# Patient Record
Sex: Male | Born: 1960 | Race: Black or African American | Hispanic: No | Marital: Single
Health system: Southern US, Community
[De-identification: ages and names within clinical notes are randomized; demographics above are authoritative.]

## PROBLEM LIST (undated history)

## (undated) DIAGNOSIS — N200 Calculus of kidney: Secondary | ICD-10-CM

---

## 2010-11-11 ENCOUNTER — Emergency Department (HOSPITAL_COMMUNITY)
Admission: EM | Admit: 2010-11-11 | Discharge: 2010-11-11 | Attending: Emergency Medicine | Admitting: Emergency Medicine

## 2010-11-11 ENCOUNTER — Emergency Department (HOSPITAL_COMMUNITY)

## 2010-11-11 DIAGNOSIS — N200 Calculus of kidney: Secondary | ICD-10-CM | POA: Insufficient documentation

## 2010-11-11 DIAGNOSIS — R1032 Left lower quadrant pain: Secondary | ICD-10-CM | POA: Insufficient documentation

## 2010-11-11 DIAGNOSIS — E119 Type 2 diabetes mellitus without complications: Secondary | ICD-10-CM | POA: Insufficient documentation

## 2010-11-11 LAB — URINALYSIS, ROUTINE W REFLEX MICROSCOPIC
Bilirubin Urine: NEGATIVE
Ketones, ur: NEGATIVE mg/dL
Nitrite: NEGATIVE
Protein, ur: NEGATIVE mg/dL
Specific Gravity, Urine: 1.025 (ref 1.005–1.030)
Urobilinogen, UA: 0.2 mg/dL (ref 0.0–1.0)

## 2010-11-11 LAB — URINE MICROSCOPIC-ADD ON

## 2011-03-26 ENCOUNTER — Emergency Department (HOSPITAL_COMMUNITY)
Admission: EM | Admit: 2011-03-26 | Discharge: 2011-03-26 | Attending: Emergency Medicine | Admitting: Emergency Medicine

## 2011-03-26 ENCOUNTER — Emergency Department (HOSPITAL_COMMUNITY)

## 2011-03-26 ENCOUNTER — Encounter: Payer: Self-pay | Admitting: *Deleted

## 2011-03-26 DIAGNOSIS — Z87442 Personal history of urinary calculi: Secondary | ICD-10-CM | POA: Insufficient documentation

## 2011-03-26 DIAGNOSIS — Z87891 Personal history of nicotine dependence: Secondary | ICD-10-CM | POA: Insufficient documentation

## 2011-03-26 DIAGNOSIS — R1012 Left upper quadrant pain: Secondary | ICD-10-CM | POA: Insufficient documentation

## 2011-03-26 DIAGNOSIS — R10819 Abdominal tenderness, unspecified site: Secondary | ICD-10-CM | POA: Insufficient documentation

## 2011-03-26 DIAGNOSIS — R1011 Right upper quadrant pain: Secondary | ICD-10-CM | POA: Insufficient documentation

## 2011-03-26 DIAGNOSIS — R109 Unspecified abdominal pain: Secondary | ICD-10-CM | POA: Insufficient documentation

## 2011-03-26 DIAGNOSIS — K5900 Constipation, unspecified: Secondary | ICD-10-CM | POA: Insufficient documentation

## 2011-03-26 DIAGNOSIS — E119 Type 2 diabetes mellitus without complications: Secondary | ICD-10-CM | POA: Insufficient documentation

## 2011-03-26 DIAGNOSIS — R1013 Epigastric pain: Secondary | ICD-10-CM | POA: Insufficient documentation

## 2011-03-26 DIAGNOSIS — R110 Nausea: Secondary | ICD-10-CM | POA: Insufficient documentation

## 2011-03-26 HISTORY — DX: Calculus of kidney: N20.0

## 2011-03-26 LAB — URINALYSIS, ROUTINE W REFLEX MICROSCOPIC
Bilirubin Urine: NEGATIVE
Glucose, UA: NEGATIVE mg/dL
Hgb urine dipstick: NEGATIVE
Ketones, ur: >80 mg/dL — AB
Leukocytes, UA: NEGATIVE
Nitrite: NEGATIVE
Protein, ur: NEGATIVE mg/dL
Specific Gravity, Urine: 1.025 (ref 1.005–1.030)
Urobilinogen, UA: 0.2 mg/dL (ref 0.0–1.0)
pH: 8 (ref 5.0–8.0)

## 2011-03-26 LAB — CBC
HCT: 45.6 % (ref 39.0–52.0)
MCH: 24.3 pg — ABNORMAL LOW (ref 26.0–34.0)
MCHC: 32 g/dL (ref 30.0–36.0)
Platelets: 256 10*3/uL (ref 150–400)
RBC: 6.02 MIL/uL — ABNORMAL HIGH (ref 4.22–5.81)
WBC: 10.6 10*3/uL — ABNORMAL HIGH (ref 4.0–10.5)

## 2011-03-26 LAB — BASIC METABOLIC PANEL
Calcium: 9.6 mg/dL (ref 8.4–10.5)
Creatinine, Ser: 1.29 mg/dL (ref 0.50–1.35)
GFR calc non Af Amer: 64 mL/min — ABNORMAL LOW (ref >90)
Glucose, Bld: 111 mg/dL — ABNORMAL HIGH (ref 70–99)
Sodium: 135 mEq/L (ref 135–145)

## 2011-03-26 MED ORDER — HYDROMORPHONE HCL 1 MG/ML IJ SOLN
1.0000 mg | Freq: Once | INTRAMUSCULAR | Status: AC
  Administered 2011-03-26: 1 mg via INTRAVENOUS
  Filled 2011-03-26: qty 1

## 2011-03-26 MED ORDER — SODIUM CHLORIDE 0.9 % IV BOLUS (SEPSIS)
1000.0000 mL | Freq: Once | INTRAVENOUS | Status: AC
  Administered 2011-03-26: 1000 mL via INTRAVENOUS

## 2011-03-26 MED ORDER — PEG 3350-KCL-NA BICARB-NACL 420 G PO SOLR
4000.0000 mL | Freq: Once | ORAL | Status: AC
  Administered 2011-03-26: 4000 mL via ORAL
  Filled 2011-03-26: qty 4000

## 2011-03-26 MED ORDER — ONDANSETRON HCL 4 MG/2ML IJ SOLN
4.0000 mg | Freq: Once | INTRAMUSCULAR | Status: AC
  Administered 2011-03-26: 4 mg via INTRAVENOUS
  Filled 2011-03-26: qty 2

## 2011-03-26 NOTE — ED Notes (Signed)
MD at bedside. 

## 2011-03-26 NOTE — ED Notes (Addendum)
Pain lt flank, worse with deep breath, no fever or cough, Nausea, unable to eat due to nausea.

## 2011-03-26 NOTE — ED Notes (Signed)
Creta Levin RN at [facility] given patient's results to test, EDP's diagnosis, and discharge instrctions with Golytely instructions-Sharon Hyacinth Meeker RN verbalized understanding. Officer given copy of ED notes, EDP's assessment, and results printed by EDP for patient chart.

## 2011-03-26 NOTE — ED Notes (Signed)
Pt reports abdominal pain for 2 days w/ nausea.

## 2011-03-26 NOTE — ED Provider Notes (Addendum)
History     CSN: [id number] Arrival date & time: 03/26/2011  1:28 AM  Chief Complaint  Patient presents with  . Flank Pain    (Consider location/radiation/quality/duration/timing/severity/associated sxs/prior treatment) HPI Comments: Seen 0241. [facility] with abdominal pain that has been present for most of the day. States pain has been intermittent and crampy. Was given MOM without results. Denies fever, chills, chest pain, shortness of breath. States that pain seems to get worse when he takes a deep breath. Mild nusea, no vomiting.   Patient is a 50 y.o. male presenting with abdominal pain. The history is provided by the patient.  Abdominal Pain The primary symptoms of the illness include abdominal pain and nausea. The current episode started 3 to 5 hours ago. The onset of the illness was sudden. The problem has been gradually worsening.  The abdominal pain radiates to the LUQ, epigastric region and RUQ. The severity of the abdominal pain is 8/10. The abdominal pain is relieved by nothing.  The patient has had a change in bowel habit (constipated). Additional symptoms associated with the illness include constipation.    Past Medical History  Diagnosis Date  . Kidney stone   . Diabetes mellitus     History reviewed. No pertinent past surgical history.  History reviewed. No pertinent family history.  History  Substance Use Topics  . Smoking status: Former Games developer  . Smokeless tobacco: Not on file  . Alcohol Use: No      Review of Systems  Gastrointestinal: Positive for nausea, abdominal pain and constipation.  All other systems reviewed and are negative.    Allergies  Review of patient's allergies indicates no known allergies.  Home Medications   Current Outpatient Rx  Name Route Sig Dispense Refill  . MAGNESIUM HYDROXIDE 400 MG/5ML PO SUSP Oral Take by mouth daily as needed.      Marland Kitchen METFORMIN HCL 500 MG PO TABS Oral Take 500 mg by mouth 3  (three) times daily.        BP 124/74  Pulse 84  Temp 98.4 F (36.9 C)  Resp 16  Wt 157 lb (71.215 kg)  SpO2 98%  Physical Exam  Nursing note and vitals reviewed. Constitutional: He is oriented to person, place, and time. He appears well-developed and well-nourished. No distress.  HENT:  Right Ear: External ear normal.  Left Ear: External ear normal.  Nose: Nose normal.  Mouth/Throat: Oropharynx is clear and moist.  Eyes: EOM are normal.  Neck: Normal range of motion. Neck supple.  Cardiovascular: Normal rate, normal heart sounds and intact distal pulses.   Pulmonary/Chest: Effort normal and breath sounds normal.  Abdominal: Soft. He exhibits no distension and no mass. There is tenderness. There is no guarding.       Mild epigastric tenderness  Neurological: He is oriented to person, place, and time.  Skin: Skin is dry.    ED Course  Procedures (including critical care time) Results for orders placed during the hospital encounter of 03/26/11  URINALYSIS, ROUTINE W REFLEX MICROSCOPIC      Component Value Range   Color, Urine AMBER (*) YELLOW    Appearance CLEAR  CLEAR    Specific Gravity, Urine 1.025  1.005 - 1.030    pH 8.0  5.0 - 8.0    Glucose, UA NEGATIVE  NEGATIVE (mg/dL)   Hgb urine dipstick NEGATIVE  NEGATIVE    Bilirubin Urine NEGATIVE  NEGATIVE    Ketones, ur >80 (*) NEGATIVE (mg/dL)  Protein, ur NEGATIVE  NEGATIVE (mg/dL)   Urobilinogen, UA 0.2  0.0 - 1.0 (mg/dL)   Nitrite NEGATIVE  NEGATIVE    Leukocytes, UA NEGATIVE  NEGATIVE   CBC      Component Value Range   WBC 10.6 (*) 4.0 - 10.5 (K/uL)   RBC 6.02 (*) 4.22 - 5.81 (MIL/uL)   Hemoglobin 14.6  13.0 - 17.0 (g/dL)   HCT 40.9  81.1 - 91.4 (%)   MCV 75.7 (*) 78.0 - 100.0 (fL)   MCH 24.3 (*) 26.0 - 34.0 (pg)   MCHC 32.0  30.0 - 36.0 (g/dL)   RDW 78.2  95.6 - 21.3 (%)   Platelets 256  150 - 400 (K/uL)  BASIC METABOLIC PANEL      Component Value Range   Sodium 135  135 - 145 (mEq/L)   Potassium 3.8   3.5 - 5.1 (mEq/L)   Chloride 97  96 - 112 (mEq/L)   CO2 29  19 - 32 (mEq/L)   Glucose, Bld 111 (*) 70 - 99 (mg/dL)   BUN 13  6 - 23 (mg/dL)   Creatinine, Ser 0.86  0.50 - 1.35 (mg/dL)   Calcium 9.6  8.4 - 57.8 (mg/dL)   GFR calc non Af Amer 64 (*) >90 (mL/min)   GFR calc Af Amer 74 (*) >90 (mL/min)    Dg Abd Acute W/chest  03/26/2011  *RADIOLOGY REPORT*  Clinical Data: Left-sided abdominal pain.  No bowel movement for 4 days.  ACUTE ABDOMEN SERIES (ABDOMEN 2 VIEW & CHEST 1 VIEW)  Comparison: None.  Findings: The Normal heart size and pulmonary vascularity.  No focal consolidation in the lungs.  No blunting of costophrenic angles.  Scattered gas and stool in the colon.  No small or large bowel dilatation.  No free intra-abdominal air.  No abnormal air fluid levels.  No radiopaque stones.  Apparent old fracture deformity of the symphysis pubis region.  IMPRESSION: No evidence of active pulmonary disease.  Nonobstructive bowel gas pattern.  Original Report Authenticated By: Marlon Pel, M.D.   MDM  Patient with abdominal pain in the setting of constipation. Labs are unremarkable. Xray shows a non obstructive pattern with a large stool burden and gas under the diaphragm. IVF, analgesics, antiemetics decreased the pain. Patient given a paper copy of the film. Golytely used as one cup every 4 hours is a feasible choice if the prison can supervise administration. Will contact and check with nurse assigned to facility.Pt feels improved after observation and/or treatment in ED.Pt stable in ED with no significant deterioration in condition.The patient appears reasonably screened and/or stabilized for discharge and I doubt any other medical condition or other [facility] requiring further screening, evaluation, or treatment in the ED at this time prior to discharge. MDM Reviewed: nursing note and vitals Interpretation: labs and x-ray           Nicoletta Dress. Colon Branch, MD 03/26/11 4696  Nicoletta Dress. Colon Branch,  MD 03/26/11 2952  Nicoletta Dress. Colon Branch, MD 04/06/11 [phone]

## 2011-03-26 NOTE — ED Notes (Signed)
Pt resting calmly w/ eyes closed. Rise & fall of the chest noted. NAD noted at this time.   

## 2012-08-05 ENCOUNTER — Emergency Department: Payer: Self-pay | Admitting: Emergency Medicine

## 2014-11-03 ENCOUNTER — Emergency Department (HOSPITAL_COMMUNITY)
Admission: EM | Admit: 2014-11-03 | Discharge: 2014-11-03 | Disposition: A | Discharge status: Home / Self Care | Attending: Emergency Medicine | Admitting: Emergency Medicine

## 2014-11-03 ENCOUNTER — Encounter (HOSPITAL_COMMUNITY): Payer: Self-pay

## 2014-11-03 ENCOUNTER — Emergency Department (HOSPITAL_COMMUNITY)

## 2014-11-03 DIAGNOSIS — R109 Unspecified abdominal pain: Secondary | ICD-10-CM | POA: Diagnosis present

## 2014-11-03 DIAGNOSIS — Z87891 Personal history of nicotine dependence: Secondary | ICD-10-CM | POA: Diagnosis not present

## 2014-11-03 DIAGNOSIS — E119 Type 2 diabetes mellitus without complications: Secondary | ICD-10-CM | POA: Diagnosis not present

## 2014-11-03 DIAGNOSIS — N200 Calculus of kidney: Secondary | ICD-10-CM | POA: Diagnosis not present

## 2014-11-03 LAB — URINALYSIS, ROUTINE W REFLEX MICROSCOPIC
BILIRUBIN URINE: NEGATIVE
Glucose, UA: >1000 mg/dL — AB
HGB URINE DIPSTICK: NEGATIVE
KETONES UR: NEGATIVE mg/dL
Leukocytes, UA: NEGATIVE
NITRITE: NEGATIVE
PROTEIN: NEGATIVE mg/dL
SPECIFIC GRAVITY, URINE: 1.02 (ref 1.005–1.030)
Urobilinogen, UA: 0.2 mg/dL (ref 0.0–1.0)
pH: 8.5 — ABNORMAL HIGH (ref 5.0–8.0)

## 2014-11-03 LAB — CBC WITH DIFFERENTIAL/PLATELET
BASOS PCT: 0 % (ref 0–1)
Basophils Absolute: 0 10*3/uL (ref 0.0–0.1)
Eosinophils Absolute: 0 10*3/uL (ref 0.0–0.7)
Eosinophils Relative: 0 % (ref 0–5)
HCT: 47.8 % (ref 39.0–52.0)
Hemoglobin: 14.7 g/dL (ref 13.0–17.0)
LYMPHS PCT: 15 % (ref 12–46)
Lymphs Abs: 1.1 10*3/uL (ref 0.7–4.0)
MCH: 23.9 pg — ABNORMAL LOW (ref 26.0–34.0)
MCHC: 30.8 g/dL (ref 30.0–36.0)
MCV: 77.9 fL — AB (ref 78.0–100.0)
MONO ABS: 0.4 10*3/uL (ref 0.1–1.0)
Monocytes Relative: 6 % (ref 3–12)
Neutro Abs: 5.7 10*3/uL (ref 1.7–7.7)
Neutrophils Relative %: 79 % — ABNORMAL HIGH (ref 43–77)
PLATELETS: 208 10*3/uL (ref 150–400)
RBC: 6.14 MIL/uL — ABNORMAL HIGH (ref 4.22–5.81)
RDW: 14.3 % (ref 11.5–15.5)
WBC: 7.2 10*3/uL (ref 4.0–10.5)

## 2014-11-03 LAB — BASIC METABOLIC PANEL
ANION GAP: 11 (ref 5–15)
BUN: 12 mg/dL (ref 6–20)
CALCIUM: 9.8 mg/dL (ref 8.9–10.3)
CO2: 27 mmol/L (ref 22–32)
Chloride: 98 mmol/L — ABNORMAL LOW (ref 101–111)
Creatinine, Ser: 1.15 mg/dL (ref 0.61–1.24)
GFR calc non Af Amer: >60 mL/min (ref >60)
GLUCOSE: 179 mg/dL — AB (ref 65–99)
Potassium: 4.5 mmol/L (ref 3.5–5.1)
SODIUM: 136 mmol/L (ref 135–145)

## 2014-11-03 LAB — HEPATIC FUNCTION PANEL
ALBUMIN: 4.4 g/dL (ref 3.5–5.0)
ALK PHOS: 70 U/L (ref 38–126)
ALT: 53 U/L (ref 17–63)
AST: 40 U/L (ref 15–41)
BILIRUBIN DIRECT: 0.1 mg/dL (ref 0.1–0.5)
BILIRUBIN INDIRECT: 0.6 mg/dL (ref 0.3–0.9)
BILIRUBIN TOTAL: 0.7 mg/dL (ref 0.3–1.2)
Total Protein: 7.6 g/dL (ref 6.5–8.1)

## 2014-11-03 LAB — CBG MONITORING, ED: Glucose-Capillary: 171 mg/dL — ABNORMAL HIGH (ref 65–99)

## 2014-11-03 LAB — URINE MICROSCOPIC-ADD ON

## 2014-11-03 LAB — LIPASE, BLOOD: Lipase: 25 U/L (ref 22–51)

## 2014-11-03 MED ORDER — PROMETHAZINE HCL 25 MG PO TABS: 25.0000 mg | ORAL_TABLET | Freq: Four times a day (QID) | ORAL | Status: DC | PRN

## 2014-11-03 MED ORDER — FENTANYL CITRATE (PF) 100 MCG/2ML IJ SOLN
100.0000 ug | Freq: Once | INTRAMUSCULAR | Status: AC
  Administered 2014-11-03: 100 ug via INTRAVENOUS
  Filled 2014-11-03: qty 2

## 2014-11-03 MED ORDER — SODIUM CHLORIDE 0.9 % IV BOLUS (SEPSIS)
1000.0000 mL | Freq: Once | INTRAVENOUS | Status: AC
  Administered 2014-11-03: 1000 mL via INTRAVENOUS

## 2014-11-03 MED ORDER — NAPROXEN 500 MG PO TABS: 500.0000 mg | ORAL_TABLET | Freq: Two times a day (BID) | ORAL | Status: DC

## 2014-11-03 MED ORDER — HYDROMORPHONE HCL 1 MG/ML IJ SOLN
1.0000 mg | Freq: Once | INTRAMUSCULAR | Status: AC
  Administered 2014-11-03: 1 mg via INTRAVENOUS
  Filled 2014-11-03: qty 1

## 2014-11-03 MED ORDER — IOHEXOL 300 MG/ML  SOLN
25.0000 mL | Freq: Once | INTRAMUSCULAR | Status: AC | PRN
  Administered 2014-11-03: 25 mL via ORAL

## 2014-11-03 MED ORDER — IOHEXOL 300 MG/ML  SOLN
100.0000 mL | Freq: Once | INTRAMUSCULAR | Status: AC | PRN
  Administered 2014-11-03: 100 mL via INTRAVENOUS

## 2014-11-03 NOTE — Discharge Instructions (Signed)
Mr. Jerry Baird had a small kidney stone on the right side. Prescription for pain and nausea medicine. If symptoms persist, recommend seen the urologist. Phone number given.

## 2014-11-03 NOTE — ED Notes (Signed)
Patient states that pain had eased off a little, but when he threw up pain returned. Asking for more pain medication at this time.

## 2014-11-03 NOTE — ED Notes (Signed)
[facility]

## 2014-11-03 NOTE — ED Notes (Signed)
Pt c/o pain in r side of abd since 2:30 this afternoon.  Denies n/v.  Reports had diarrhea once early this morning.  Denies pain or burning with urination.

## 2014-11-03 NOTE — ED Provider Notes (Signed)
CSN: [id number]     Arrival date & time 11/03/14  1546 History   First MD Initiated Contact with Patient 11/03/14 1820     Chief Complaint  Patient presents with  . Abdominal Pain     (Consider location/radiation/quality/duration/timing/severity/associated sxs/prior Treatment) HPI.... Right anterior diffuse abdominal pain since approximately 1400 today. Patient ate lunch without issues. No vomiting, diarrhea, fever, chills, dysuria, hematuria. History of kidney stones in the past. Severity is moderate. Nothing makes symptoms better or worse. Past medical history includes diabetes. No radiation of pain to the flank  Past Medical History  Diagnosis Date  . Kidney stone   . Diabetes mellitus    History reviewed. No pertinent past surgical history. No family history on file. History  Substance Use Topics  . Smoking status: Former Games developermoker  . Smokeless tobacco: Not on file  . Alcohol Use: No    Review of Systems  All other systems reviewed and are negative.     Allergies  Review of patient's allergies indicates no known allergies.  Home Medications   Prior to Admission medications   Medication Sig Start Date End Date Taking? Authorizing Provider  naproxen (NAPROSYN) 500 MG tablet Take 1 tablet (500 mg total) by mouth 2 (two) times daily. 11/03/14   Donnetta HutchingBrian Sulma Ruffino, MD  promethazine (PHENERGAN) 25 MG tablet Take 1 tablet (25 mg total) by mouth every 6 (six) hours as needed. 11/03/14   Donnetta HutchingBrian Akeria Hedstrom, MD   BP 135/72 mmHg  Pulse 81  Temp(Src) 97.7 F (36.5 C) (Oral)  Resp 20  Ht 5\' 7"  (1.702 m)  Wt 167 lb (75.751 kg)  BMI 26.15 kg/m2  SpO2 97% Physical Exam  Constitutional: He is oriented to person, place, and time. He appears well-developed and well-nourished.  HENT:  Head: Normocephalic and atraumatic.  Eyes: Conjunctivae and EOM are normal. Pupils are equal, round, and reactive to light.  Neck: Normal range of motion. Neck supple.  Cardiovascular: Normal rate and regular  rhythm.   Pulmonary/Chest: Effort normal and breath sounds normal.  Abdominal:  Minimal right-sided abdominal tenderness  Musculoskeletal: Normal range of motion.  Neurological: He is alert and oriented to person, place, and time.  Skin: Skin is warm and dry.  Psychiatric: He has a normal mood and affect. His behavior is normal.  Nursing note and vitals reviewed.   ED Course  Procedures (including critical care time) Labs Review Labs Reviewed  URINALYSIS, ROUTINE W REFLEX [facility]CROSCOPIC ([facility]) - Abnormal; Notable for the following:    Color, Urine STRAW (*)    pH 8.5 (*)    Glucose, UA >1000 (*)    All other components within normal limits  CBC WITH DIFFERENTIAL/PLATELET - Abnormal; Notable for the following:    RBC 6.14 (*)    MCV 77.9 (*)    MCH 23.9 (*)    Neutrophils Relative % 79 (*)    All other components within normal limits  BASIC METABOLIC PANEL - Abnormal; Notable for the following:    Chloride 98 (*)    Glucose, Bld 179 (*)    All other components within normal limits  CBG MONITORING, ED - Abnormal; Notable for the following:    Glucose-Capillary 171 (*)    All other components within normal limits  HEPATIC FUNCTION PANEL  LIPASE, BLOOD  URINE MICROSCOPIC-ADD ON    Imaging Review Ct Abdomen Pelvis W Contrast  11/03/2014   CLINICAL DATA:  54 year old male with acute right-sided abdominal and pelvic pain today.  EXAM:  CT ABDOMEN AND PELVIS WITH CONTRAST  TECHNIQUE: Multidetector CT imaging of the abdomen and pelvis was performed using the standard protocol following bolus administration of intravenous contrast.  CONTRAST:  OMNIPAQUE IOHEXOL 300 MG/ML  SOLN  COMPARISON:  11/11/2010 CT  FINDINGS: Lower chest:  Unremarkable  Hepatobiliary: Fatty infiltration of the liver is noted. No focal hepatic abnormalities are identified. The gallbladder is unremarkable. There is no evidence of biliary dilatation.  Pancreas: Unremarkable  Spleen: Unremarkable   Adrenals/Urinary Tract: A 1.5 mm right UVJ calculus is noted. Right perinephric fluid is compatible with calyceal rupture and decompression of the collecting system. There is no evidence of hydronephrosis. The left kidney, adrenal glands and bladder are otherwise unremarkable.  Stomach/Bowel: Unremarkable. There is no evidence of bowel wall thickening, bowel obstruction or pneumoperitoneum. The appendix is normal.  Vascular/Lymphatic: Unremarkable. No enlarged lymph nodes or abdominal aortic aneurysm.  Reproductive: Mild prostate enlargement noted.  Other: No free fluid or abscess.  Musculoskeletal: No acute or suspicious abnormalities. Moderate degenerative disc disease and facet arthropathy in the lower lumbar spine noted.  IMPRESSION: 1.5 mm right UVJ calculus with right perinephric fluid, compatible with calyceal rupture and decompression of the right renal collecting system.  Hepatic steatosis.   Electronically Signed   By: Harmon Pier M.D.   On: 11/03/2014 19:48     EKG Interpretation None      MDM   Final diagnoses:  Right kidney stone    CT scan of abdomen/pelvis reveals a 1.5 mm right UVJ stone with right perinephric fluid compatible with calyceal rupture and decompression of the right renal collecting system.  Patient is hemodynamically stable. Pain management given. Discussed with urologist. Discharge medications Naprosyn 500 mg and Phenergan 25 mg    Donnetta Hutching, MD 11/03/14 2158

## 2015-07-14 ENCOUNTER — Observation Stay (HOSPITAL_COMMUNITY)
Admission: EM | Admit: 2015-07-14 | Discharge: 2015-07-15 | Disposition: A | Discharge status: Home / Self Care | Attending: Internal Medicine | Admitting: Internal Medicine

## 2015-07-14 ENCOUNTER — Other Ambulatory Visit: Payer: Self-pay

## 2015-07-14 ENCOUNTER — Emergency Department (HOSPITAL_COMMUNITY)

## 2015-07-14 ENCOUNTER — Encounter (HOSPITAL_COMMUNITY): Payer: Self-pay

## 2015-07-14 DIAGNOSIS — E118 Type 2 diabetes mellitus with unspecified complications: Secondary | ICD-10-CM | POA: Diagnosis not present

## 2015-07-14 DIAGNOSIS — E119 Type 2 diabetes mellitus without complications: Secondary | ICD-10-CM | POA: Diagnosis not present

## 2015-07-14 DIAGNOSIS — Z87891 Personal history of nicotine dependence: Secondary | ICD-10-CM | POA: Diagnosis not present

## 2015-07-14 DIAGNOSIS — N200 Calculus of kidney: Secondary | ICD-10-CM | POA: Insufficient documentation

## 2015-07-14 DIAGNOSIS — Z791 Long term (current) use of non-steroidal anti-inflammatories (NSAID): Secondary | ICD-10-CM | POA: Diagnosis not present

## 2015-07-14 DIAGNOSIS — E785 Hyperlipidemia, unspecified: Secondary | ICD-10-CM

## 2015-07-14 DIAGNOSIS — R0789 Other chest pain: Secondary | ICD-10-CM | POA: Diagnosis present

## 2015-07-14 DIAGNOSIS — R079 Chest pain, unspecified: Principal | ICD-10-CM | POA: Insufficient documentation

## 2015-07-14 DIAGNOSIS — I10 Essential (primary) hypertension: Secondary | ICD-10-CM

## 2015-07-14 LAB — COMPREHENSIVE METABOLIC PANEL
ALBUMIN: 4.4 g/dL (ref 3.5–5.0)
ALK PHOS: 94 U/L (ref 38–126)
ALT: 34 U/L (ref 17–63)
AST: 25 U/L (ref 15–41)
Anion gap: 9 (ref 5–15)
BUN: 17 mg/dL (ref 6–20)
CALCIUM: 9.9 mg/dL (ref 8.9–10.3)
CHLORIDE: 104 mmol/L (ref 101–111)
CO2: 25 mmol/L (ref 22–32)
CREATININE: 0.96 mg/dL (ref 0.61–1.24)
GFR calc Af Amer: >60 mL/min (ref >60)
GFR calc non Af Amer: >60 mL/min (ref >60)
GLUCOSE: 239 mg/dL — AB (ref 65–99)
Potassium: 4.4 mmol/L (ref 3.5–5.1)
SODIUM: 138 mmol/L (ref 135–145)
Total Bilirubin: 0.8 mg/dL (ref 0.3–1.2)
Total Protein: 7.5 g/dL (ref 6.5–8.1)

## 2015-07-14 LAB — CBC WITH DIFFERENTIAL/PLATELET
BASOS ABS: 0 10*3/uL (ref 0.0–0.1)
Basophils Relative: 0 %
EOS ABS: 0 10*3/uL (ref 0.0–0.7)
Eosinophils Relative: 0 %
HCT: 49.8 % (ref 39.0–52.0)
HEMOGLOBIN: 15.8 g/dL (ref 13.0–17.0)
LYMPHS ABS: 0.8 10*3/uL (ref 0.7–4.0)
Lymphocytes Relative: 12 %
MCH: 24.5 pg — AB (ref 26.0–34.0)
MCHC: 31.7 g/dL (ref 30.0–36.0)
MCV: 77.3 fL — ABNORMAL LOW (ref 78.0–100.0)
Monocytes Absolute: 0.4 10*3/uL (ref 0.1–1.0)
Monocytes Relative: 6 %
NEUTROS PCT: 82 %
Neutro Abs: 5.2 10*3/uL (ref 1.7–7.7)
Platelets: 174 10*3/uL (ref 150–400)
RBC: 6.44 MIL/uL — AB (ref 4.22–5.81)
RDW: 14.1 % (ref 11.5–15.5)
WBC: 6.4 10*3/uL (ref 4.0–10.5)

## 2015-07-14 LAB — TROPONIN I
Troponin I: <0.03 ng/mL (ref <0.031)
Troponin I: <0.03 ng/mL (ref <0.031)

## 2015-07-14 LAB — GLUCOSE, CAPILLARY: GLUCOSE-CAPILLARY: 120 mg/dL — AB (ref 65–99)

## 2015-07-14 MED ORDER — ENOXAPARIN SODIUM 40 MG/0.4ML ~~LOC~~ SOLN
40.0000 mg | SUBCUTANEOUS | Status: DC
  Administered 2015-07-14: 40 mg via SUBCUTANEOUS
  Filled 2015-07-14: qty 0.4

## 2015-07-14 MED ORDER — LISINOPRIL 10 MG PO TABS
10.0000 mg | ORAL_TABLET | Freq: Every day | ORAL | Status: DC
  Administered 2015-07-15: 10 mg via ORAL
  Filled 2015-07-14: qty 1

## 2015-07-14 MED ORDER — METOPROLOL TARTRATE 25 MG PO TABS
25.0000 mg | ORAL_TABLET | Freq: Two times a day (BID) | ORAL | Status: DC
  Administered 2015-07-14 – 2015-07-15 (×2): 25 mg via ORAL
  Filled 2015-07-14 (×2): qty 1

## 2015-07-14 MED ORDER — SODIUM CHLORIDE 0.9 % IV SOLN
INTRAVENOUS | Status: DC
  Administered 2015-07-14 – 2015-07-15 (×3): via INTRAVENOUS

## 2015-07-14 MED ORDER — ASPIRIN EC 325 MG PO TBEC
325.0000 mg | DELAYED_RELEASE_TABLET | Freq: Every day | ORAL | Status: DC
  Administered 2015-07-15: 325 mg via ORAL
  Filled 2015-07-14: qty 1

## 2015-07-14 MED ORDER — ONDANSETRON HCL 4 MG PO TABS: 4.0000 mg | ORAL_TABLET | Freq: Four times a day (QID) | ORAL | Status: DC | PRN

## 2015-07-14 MED ORDER — ATORVASTATIN CALCIUM 40 MG PO TABS
40.0000 mg | ORAL_TABLET | Freq: Every day | ORAL | Status: DC
  Administered 2015-07-14 – 2015-07-15 (×2): 40 mg via ORAL
  Filled 2015-07-14 (×2): qty 1

## 2015-07-14 MED ORDER — ONDANSETRON HCL 4 MG/2ML IJ SOLN: 4.0000 mg | Freq: Four times a day (QID) | INTRAMUSCULAR | Status: DC | PRN

## 2015-07-14 MED ORDER — INSULIN ASPART 100 UNIT/ML ~~LOC~~ SOLN: 0.0000 [IU] | Freq: Every day | SUBCUTANEOUS | Status: DC

## 2015-07-14 MED ORDER — SODIUM CHLORIDE 0.9% FLUSH
3.0000 mL | Freq: Two times a day (BID) | INTRAVENOUS | Status: DC
  Administered 2015-07-14: 3 mL via INTRAVENOUS

## 2015-07-14 MED ORDER — MORPHINE SULFATE (PF) 2 MG/ML IV SOLN: 2.0000 mg | INTRAVENOUS | Status: DC | PRN

## 2015-07-14 MED ORDER — INSULIN ASPART 100 UNIT/ML ~~LOC~~ SOLN
0.0000 [IU] | Freq: Three times a day (TID) | SUBCUTANEOUS | Status: DC
  Administered 2015-07-15: 3 [IU] via SUBCUTANEOUS
  Administered 2015-07-15: 1 [IU] via SUBCUTANEOUS

## 2015-07-14 MED ORDER — SODIUM CHLORIDE 0.9 % IV BOLUS (SEPSIS)
500.0000 mL | Freq: Once | INTRAVENOUS | Status: AC
  Administered 2015-07-14: 500 mL via INTRAVENOUS

## 2015-07-14 MED ORDER — ASPIRIN 81 MG PO CHEW
324.0000 mg | CHEWABLE_TABLET | Freq: Once | ORAL | Status: AC
  Administered 2015-07-14: 324 mg via ORAL
  Filled 2015-07-14: qty 4

## 2015-07-14 MED ORDER — NITROGLYCERIN 0.4 MG SL SUBL
0.4000 mg | SUBLINGUAL_TABLET | SUBLINGUAL | Status: DC | PRN
  Administered 2015-07-14: 0.4 mg via SUBLINGUAL
  Filled 2015-07-14: qty 1
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
## 2015-07-14 NOTE — ED Notes (Signed)
Pt is from [place] [facility], here with c/o chest pain onset 2 hours ago.

## 2015-07-14 NOTE — ED Notes (Signed)
Report given to receiving RN by SN, Sophronia Simas.
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
## 2015-07-15 ENCOUNTER — Observation Stay (HOSPITAL_COMMUNITY)

## 2015-07-15 DIAGNOSIS — R0789 Other chest pain: Secondary | ICD-10-CM | POA: Diagnosis not present

## 2015-07-15 DIAGNOSIS — E119 Type 2 diabetes mellitus without complications: Secondary | ICD-10-CM

## 2015-07-15 DIAGNOSIS — E785 Hyperlipidemia, unspecified: Secondary | ICD-10-CM

## 2015-07-15 DIAGNOSIS — I10 Essential (primary) hypertension: Secondary | ICD-10-CM | POA: Diagnosis not present

## 2015-07-15 LAB — GLUCOSE, CAPILLARY
Glucose-Capillary: 140 mg/dL — ABNORMAL HIGH (ref 65–99)
Glucose-Capillary: 96 mg/dL (ref 65–99)
Glucose-Capillary: 201 mg/dL — ABNORMAL HIGH (ref 65–99)

## 2015-07-15 LAB — LIPID PANEL
CHOL/HDL RATIO: 4.7 ratio
Cholesterol: 180 mg/dL (ref 0–200)
HDL: 38 mg/dL — AB (ref >40)
LDL CALC: 118 mg/dL — AB (ref 0–99)
Triglycerides: 121 mg/dL (ref <150)
VLDL: 24 mg/dL (ref 0–40)

## 2015-07-15 LAB — MRSA PCR SCREENING: MRSA BY PCR: NEGATIVE

## 2015-07-15 LAB — TROPONIN I
Troponin I: <0.03 ng/mL (ref <0.031)
Troponin I: <0.03 ng/mL (ref <0.031)

## 2015-07-15 MED ORDER — METFORMIN HCL 500 MG PO TABS: 500.0000 mg | ORAL_TABLET | Freq: Two times a day (BID) | ORAL | Status: AC

## 2015-07-15 MED ORDER — ATORVASTATIN CALCIUM 40 MG PO TABS: 40.0000 mg | ORAL_TABLET | Freq: Every day | ORAL | Status: AC

## 2015-07-15 MED ORDER — LISINOPRIL 10 MG PO TABS: 10.0000 mg | ORAL_TABLET | Freq: Every day | ORAL | Status: AC

## 2015-07-15 MED ORDER — METOPROLOL TARTRATE 25 MG PO TABS: 25.0000 mg | ORAL_TABLET | Freq: Two times a day (BID) | ORAL | Status: AC

## 2015-07-15 NOTE — Progress Notes (Signed)
Patient with orders to be discharged back to [facility] facility. Discharge instructions and prescriptions given to prison guard. [facility] prison triage doctor and MD called to verify patient ok to return to facility. Patient transported via Technical sales engineer.

## 2015-07-15 NOTE — Progress Notes (Signed)
  Echocardiogram 2D Echocardiogram has been performed.  Jerry Baird 07/15/2015, 2:01 PM
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
## 2015-12-02 IMAGING — CT CT ABD-PELV W/ CM
2 of 5 series · 16 of 46 positions shown, 18 images · IV contrast (Omnipaque 300)
Comparison: 11/11/2010 CT

CLINICAL DATA: 63-year-old male with acute right-sided abdominal
and pelvic pain today.

EXAM:
CT ABDOMEN AND PELVIS WITH CONTRAST
TECHNIQUE: Multidetector CT imaging of the abdomen and pelvis was performed
using the standard protocol following bolus administration of
intravenous contrast.
CONTRAST:  100mL OMNIPAQUE IOHEXOL 300 MG/ML  SOLN

[Series 2: abd_pel_with 5.0 b40f · axial · 0.82mm/px · z∈[-285,+115]mm · 13 of 90 slices shown, 15 images]
[im 5/90  soft-tissue]
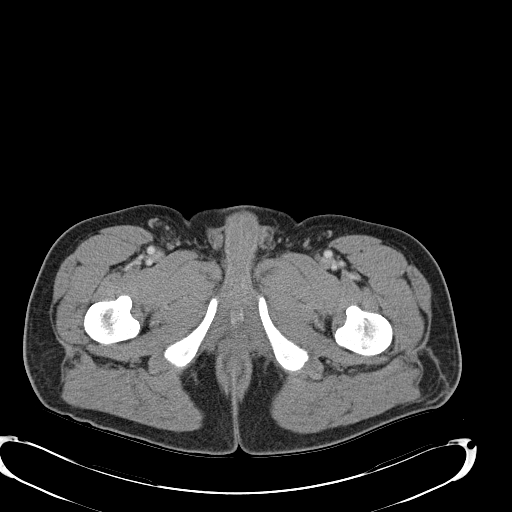
[im 5/90  bone]
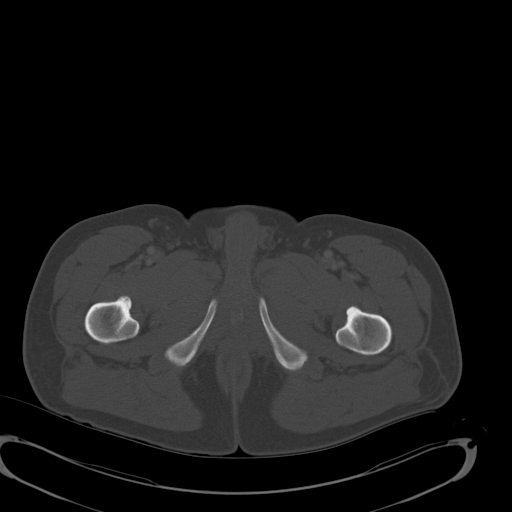
[im 15/90  soft-tissue]
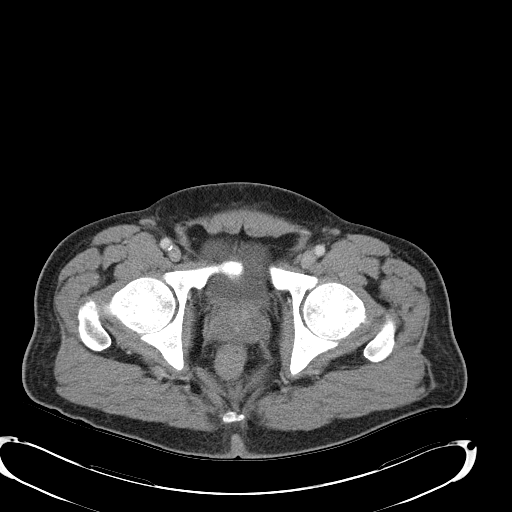
[im 19/90  soft-tissue]
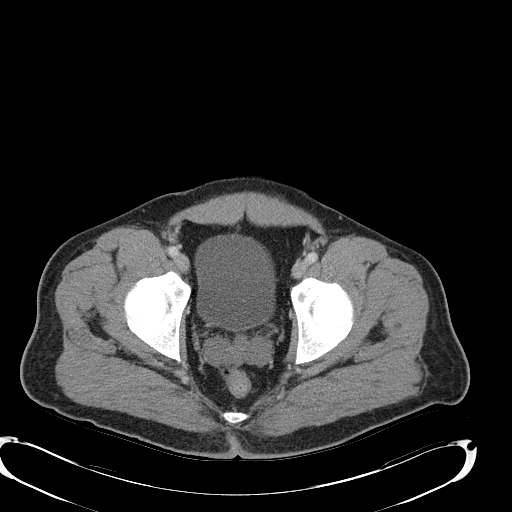
[im 24/90  soft-tissue]
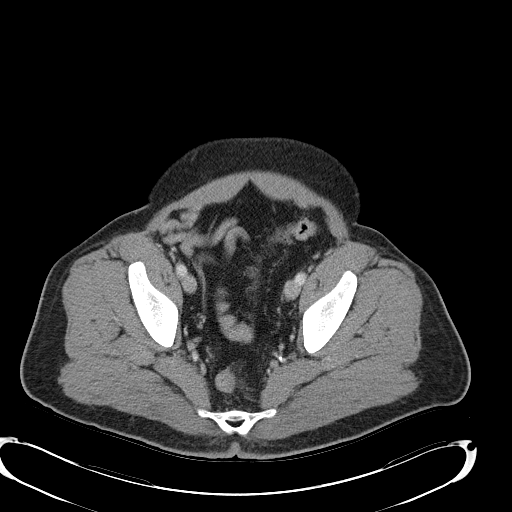
[im 33/90  soft-tissue]
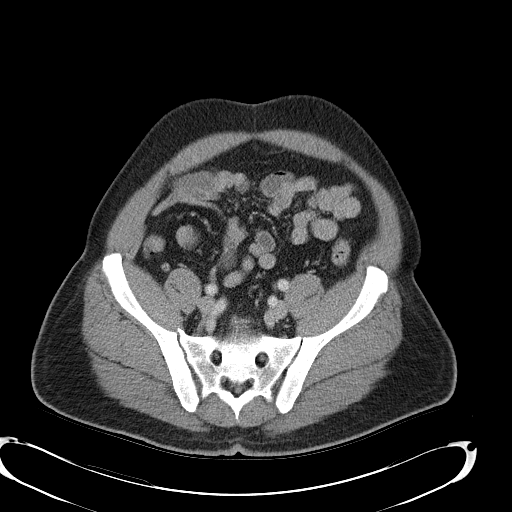
[im 38/90  soft-tissue]
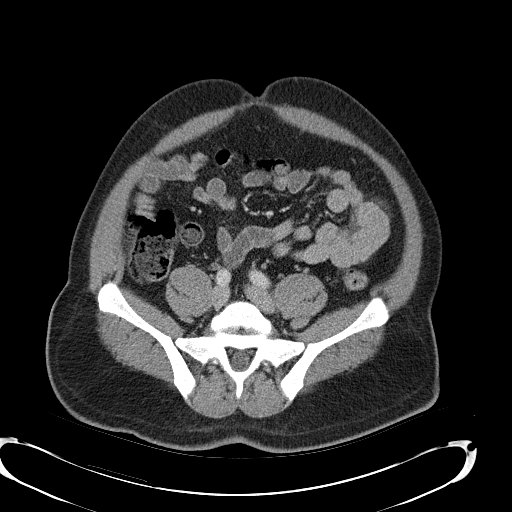
[im 47/90  soft-tissue]
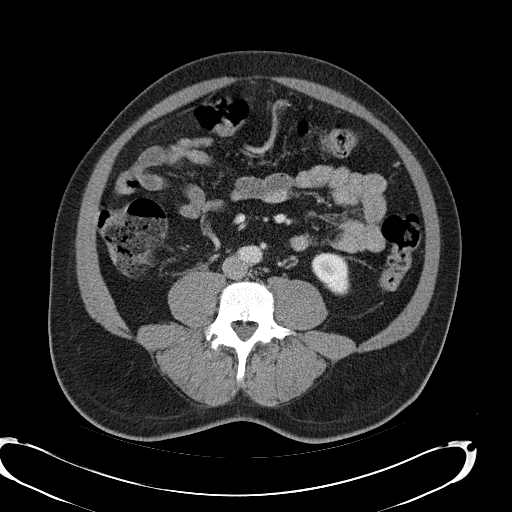
[im 52/90  soft-tissue]
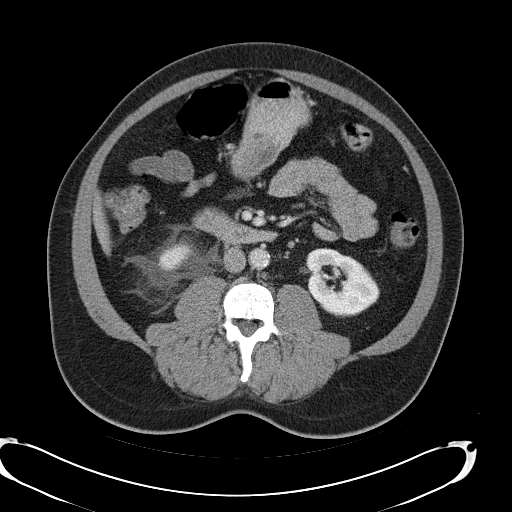
[im 57/90  soft-tissue]
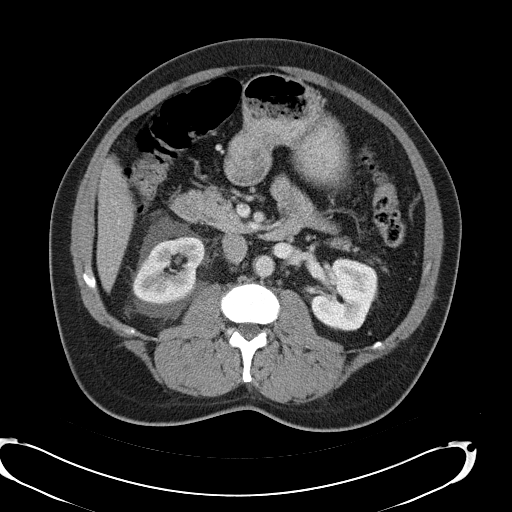
[im 57/90  bone]
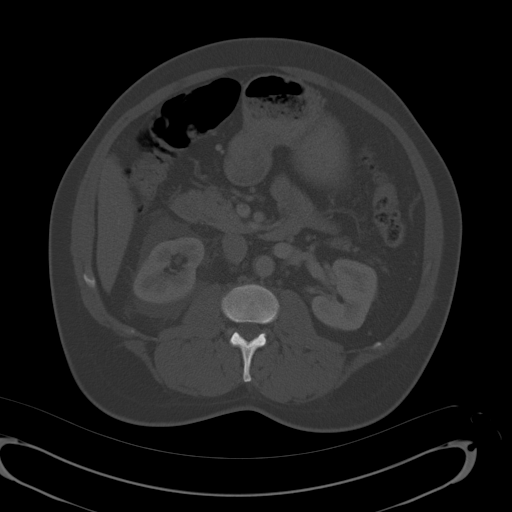
[im 66/90  soft-tissue]
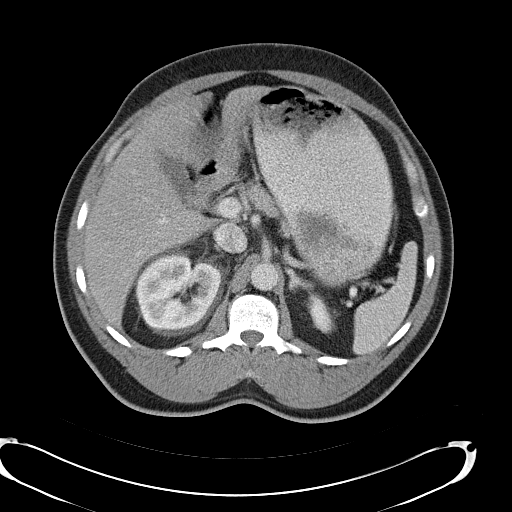
[im 71/90  soft-tissue]
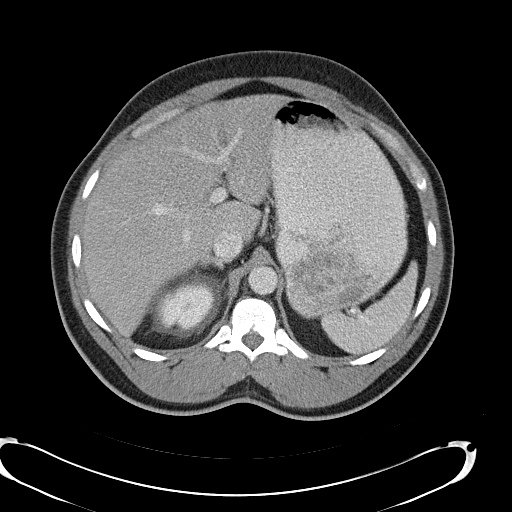
[im 75/90  soft-tissue]
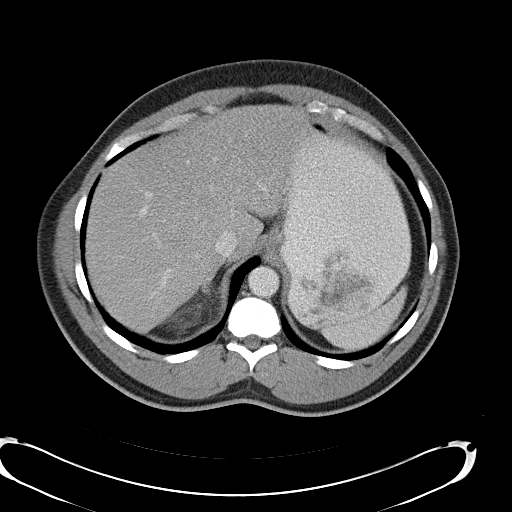
[im 85/90  soft-tissue]
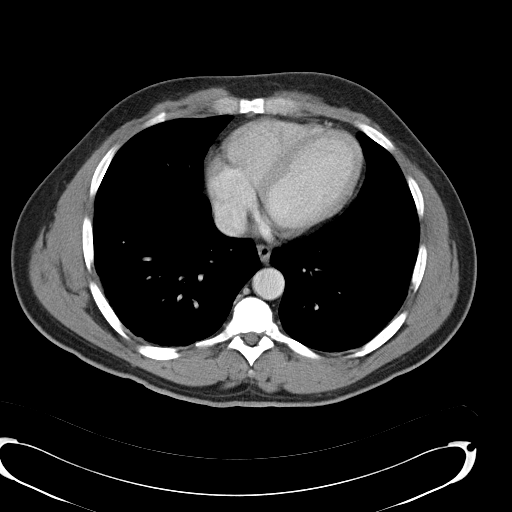

[Series 5: abd_pel_with 3.0 spo · coronal · 0.68mm/px · 3 of 107 slices shown]
[im 36/107  soft-tissue]
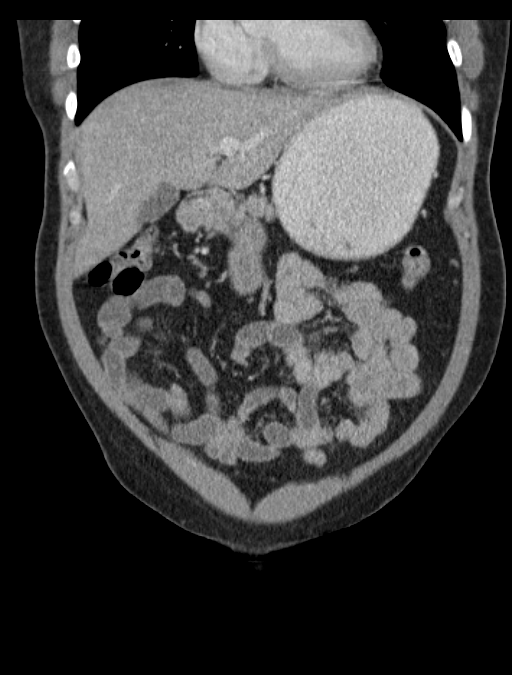
[im 48/107  soft-tissue]
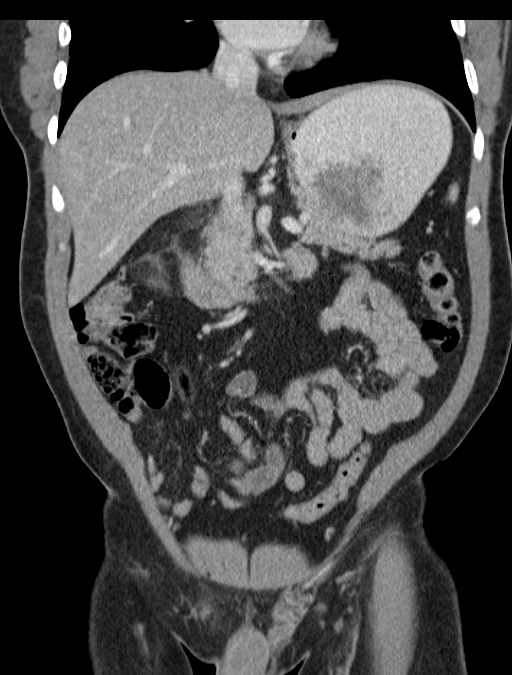
[im 59/107  soft-tissue]
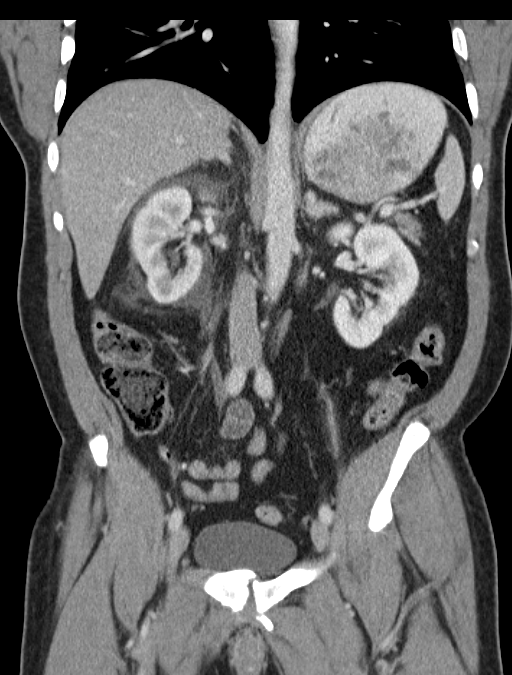

[16 of 46 positions shown; findings below may reference images not displayed]

FINDINGS: Lower chest:  Unremarkable

Hepatobiliary: Fatty infiltration of the liver is noted. No focal
hepatic abnormalities are identified. The gallbladder is
unremarkable. There is no evidence of biliary dilatation.

Pancreas: Unremarkable

Spleen: Unremarkable

Adrenals/Urinary Tract: A 1.5 mm right UVJ calculus is noted. Right
perinephric fluid is compatible with calyceal rupture and
decompression of the collecting system. There is no evidence of
hydronephrosis. The left kidney, adrenal glands and bladder are
otherwise unremarkable.

Stomach/Bowel: Unremarkable. There is no evidence of bowel wall
thickening, bowel obstruction or pneumoperitoneum. The appendix is
normal.

Vascular/Lymphatic: Unremarkable. No enlarged lymph nodes or
abdominal aortic aneurysm.

Reproductive: Mild prostate enlargement noted.

Other: No free fluid or abscess.

Musculoskeletal: No acute or suspicious abnormalities. Moderate
degenerative disc disease and facet arthropathy in the lower lumbar
spine noted.
IMPRESSION: 1.5 mm right UVJ calculus with right perinephric fluid, compatible
with calyceal rupture and decompression of the right renal
collecting system.

Hepatic steatosis.

## 2016-08-11 IMAGING — DX DG CHEST 2V
2 series · 2 of 2 positions shown · non-contrast
Comparison: 03/26/2011 chest radiograph

CLINICAL DATA: 54-year-old male with acute chest pain today.

EXAM:
CHEST  2 VIEW

[chest pa]
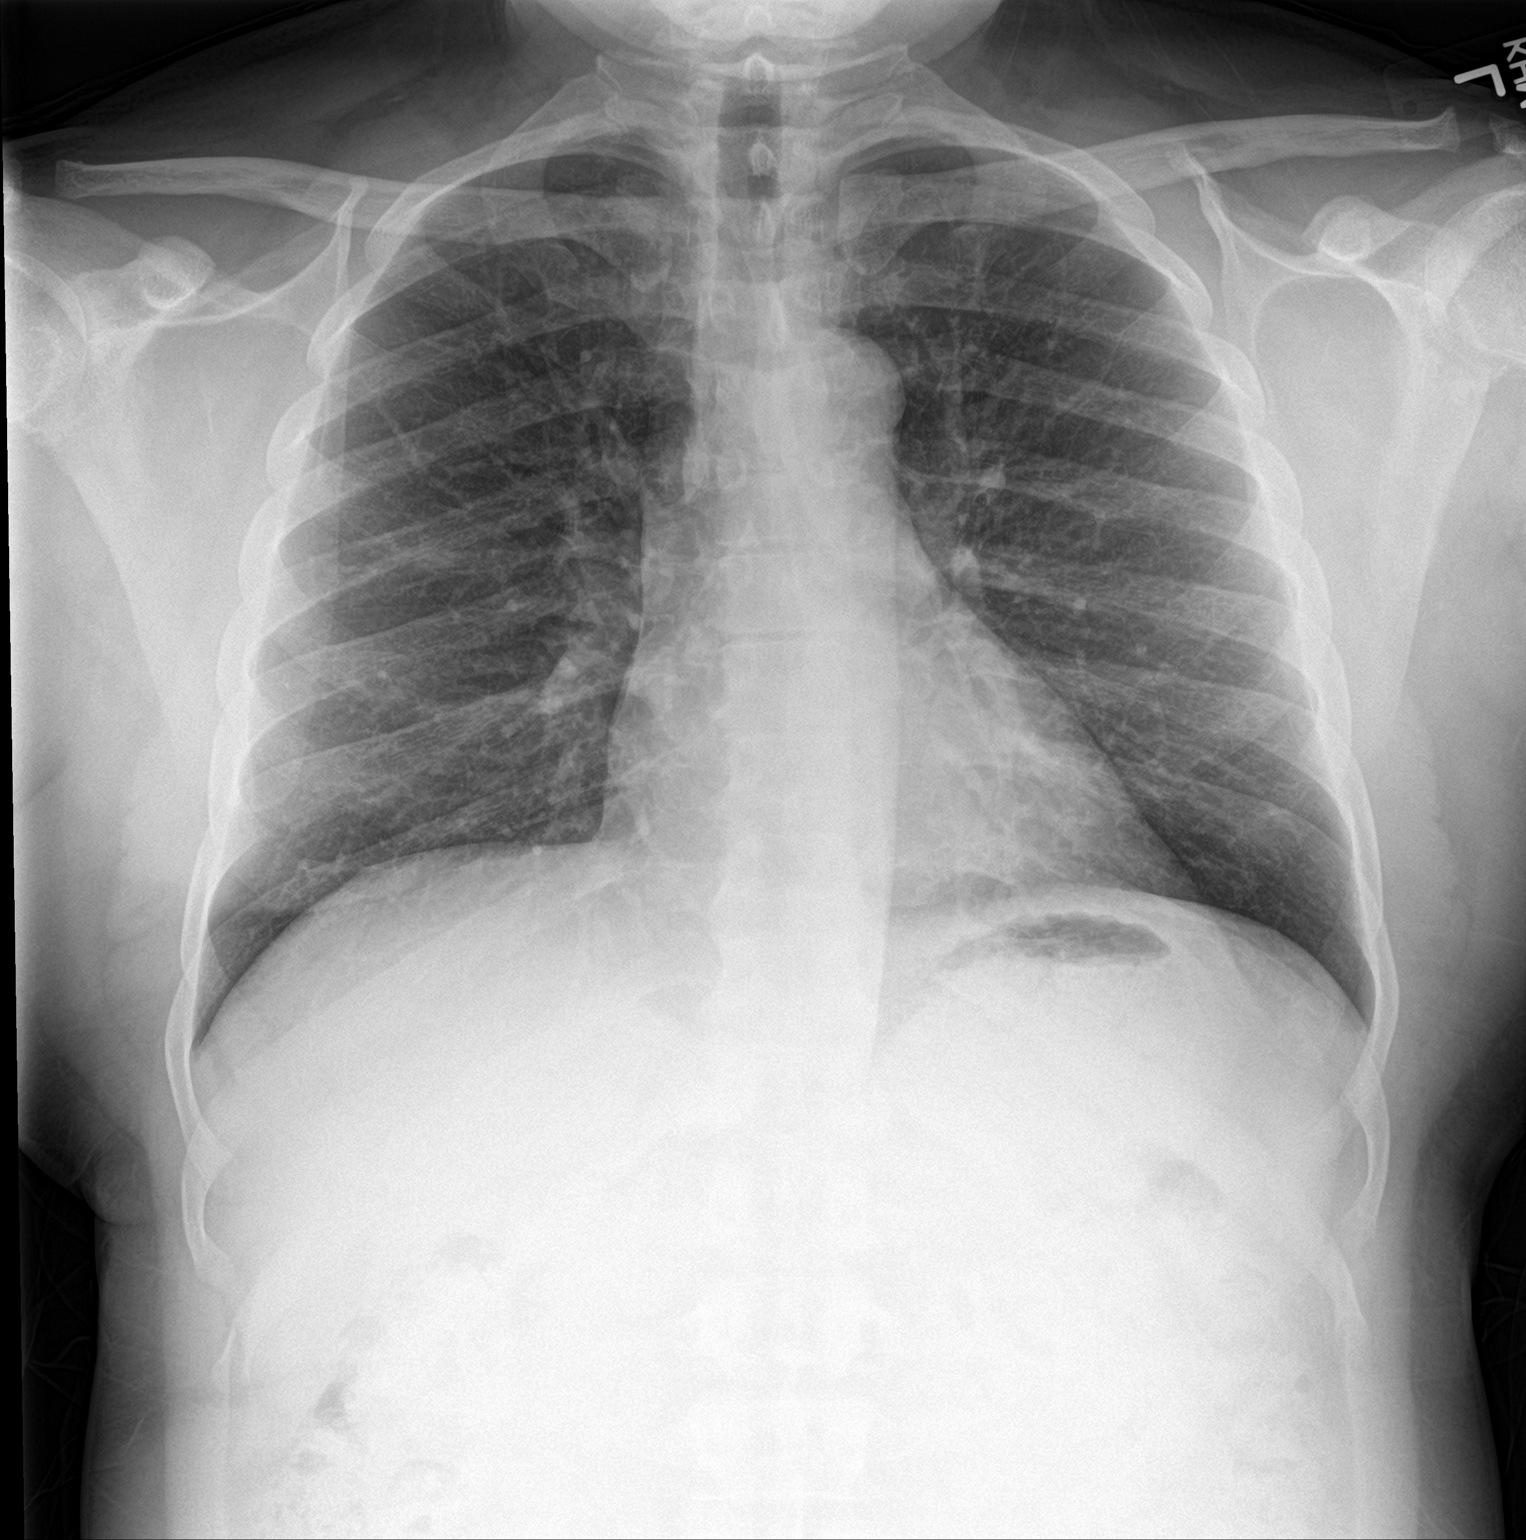

[chest lat]
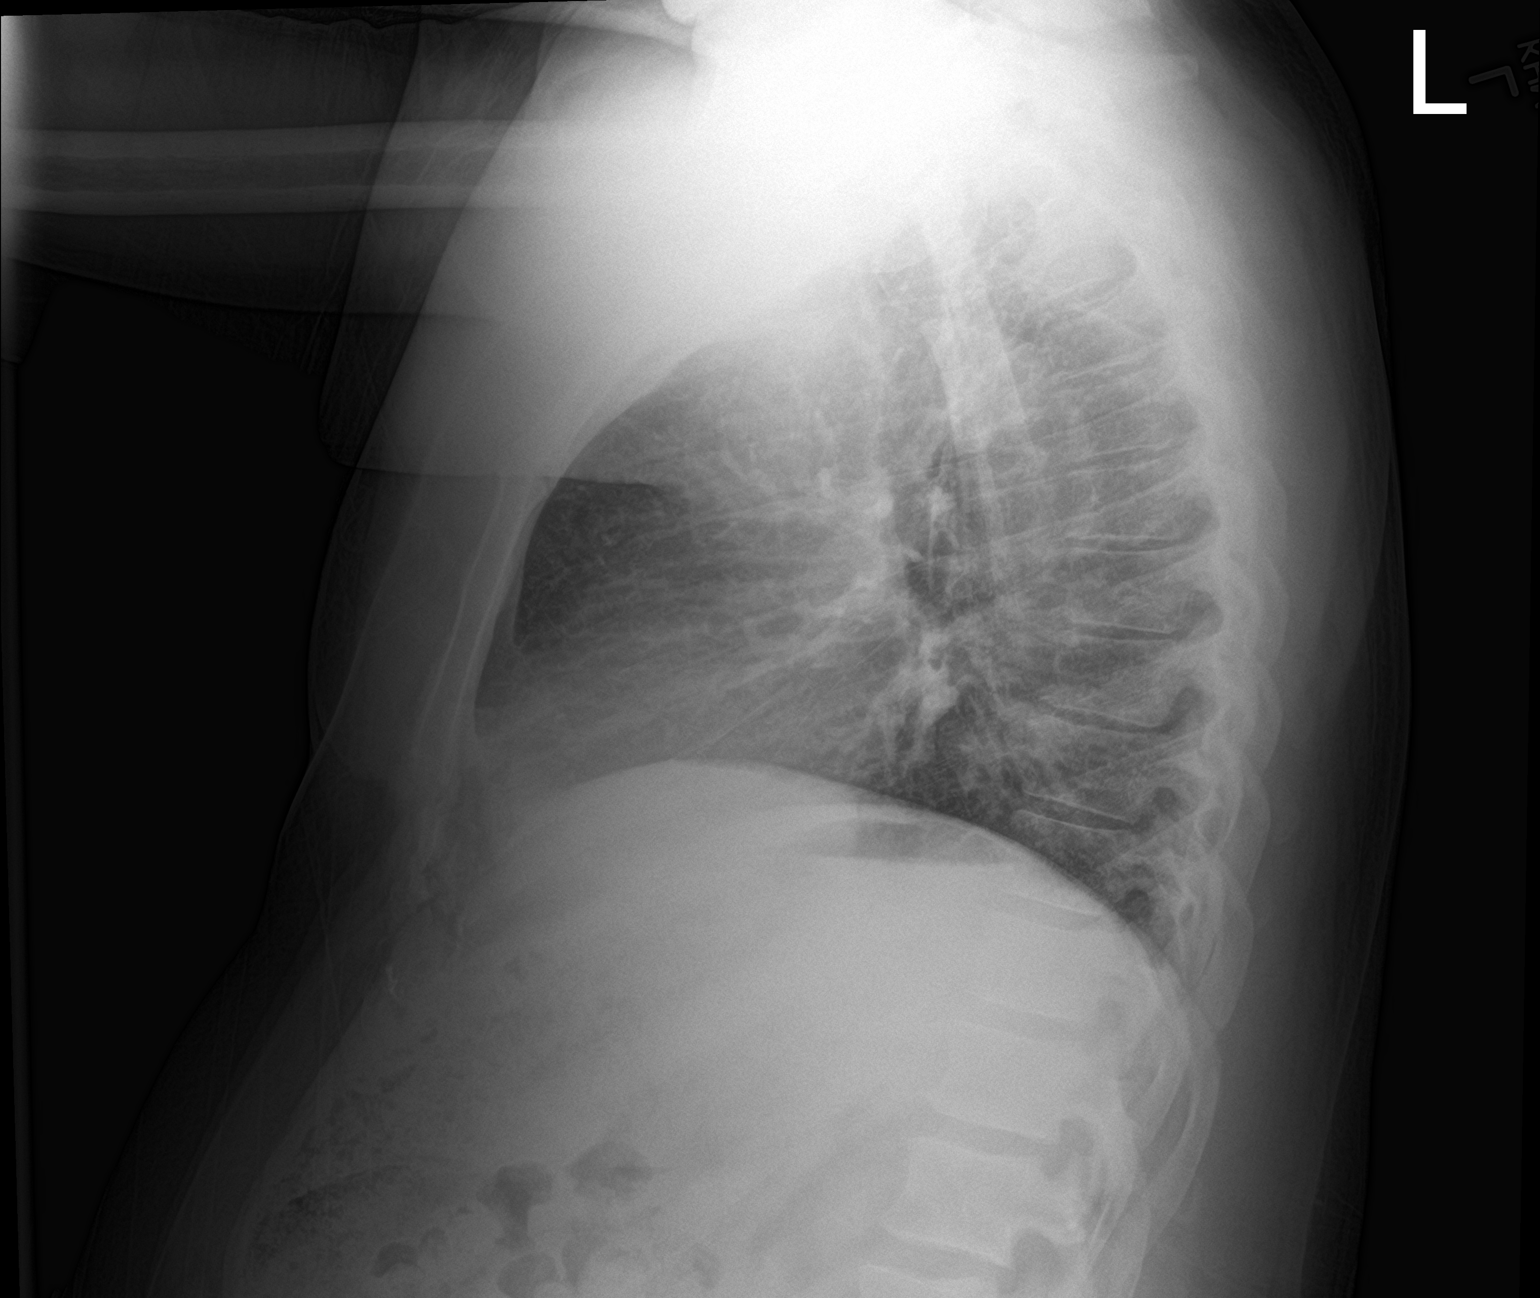

[2 of 2 positions shown; findings below may reference images not displayed]

FINDINGS: The cardiomediastinal silhouette is unremarkable.

There is no evidence of focal airspace disease, pulmonary edema,
suspicious pulmonary nodule/mass, pleural effusion, or pneumothorax.
No acute bony abnormalities are identified.
IMPRESSION: No active cardiopulmonary disease.
